# Patient Record
Sex: Male | Born: 1970 | Race: White | Hispanic: No | Marital: Married | Smoking: Former smoker
Health system: Southern US, Community
[De-identification: ages and names within clinical notes are randomized; demographics above are authoritative.]

## PROBLEM LIST (undated history)

## (undated) DIAGNOSIS — S8990XA Unspecified injury of unspecified lower leg, initial encounter: Secondary | ICD-10-CM

## (undated) DIAGNOSIS — S3992XA Unspecified injury of lower back, initial encounter: Secondary | ICD-10-CM

## (undated) DIAGNOSIS — F329 Major depressive disorder, single episode, unspecified: Secondary | ICD-10-CM

## (undated) DIAGNOSIS — F32A Depression, unspecified: Secondary | ICD-10-CM

## (undated) DIAGNOSIS — K219 Gastro-esophageal reflux disease without esophagitis: Secondary | ICD-10-CM

## (undated) HISTORY — DX: Depression, unspecified: F32.A

## (undated) HISTORY — PX: KNEE SURGERY: SHX244

## (undated) HISTORY — DX: Unspecified injury of unspecified lower leg, initial encounter: S89.90XA

## (undated) HISTORY — PX: OTHER SURGICAL HISTORY: SHX169

## (undated) HISTORY — PX: TYMPANOSTOMY TUBE PLACEMENT: SHX32

## (undated) HISTORY — DX: Major depressive disorder, single episode, unspecified: F32.9

## (undated) HISTORY — DX: Unspecified injury of lower back, initial encounter: S39.92XA

## (undated) HISTORY — DX: Gastro-esophageal reflux disease without esophagitis: K21.9

---

## 2000-09-05 ENCOUNTER — Emergency Department (HOSPITAL_COMMUNITY): Admission: EM | Admit: 2000-09-05 | Discharge: 2000-09-06 | Payer: Self-pay

## 2001-09-28 ENCOUNTER — Encounter: Payer: Self-pay | Admitting: Neurosurgery

## 2001-09-28 ENCOUNTER — Encounter: Admission: RE | Admit: 2001-09-28 | Discharge: 2001-09-28 | Payer: Self-pay | Admitting: Neurosurgery

## 2002-10-03 ENCOUNTER — Emergency Department (HOSPITAL_COMMUNITY): Admission: EM | Admit: 2002-10-03 | Discharge: 2002-10-03 | Payer: Self-pay | Admitting: Emergency Medicine

## 2002-10-03 ENCOUNTER — Encounter: Payer: Self-pay | Admitting: Emergency Medicine

## 2002-11-03 ENCOUNTER — Emergency Department (HOSPITAL_COMMUNITY): Admission: EM | Admit: 2002-11-03 | Discharge: 2002-11-03 | Payer: Self-pay | Admitting: Emergency Medicine

## 2003-05-25 ENCOUNTER — Emergency Department (HOSPITAL_COMMUNITY): Admission: EM | Admit: 2003-05-25 | Discharge: 2003-05-25 | Payer: Self-pay | Admitting: Emergency Medicine

## 2005-05-07 ENCOUNTER — Emergency Department (HOSPITAL_COMMUNITY): Admission: EM | Admit: 2005-05-07 | Discharge: 2005-05-07 | Payer: Self-pay | Admitting: Family Medicine

## 2005-08-03 ENCOUNTER — Emergency Department (HOSPITAL_COMMUNITY): Admission: EM | Admit: 2005-08-03 | Discharge: 2005-08-03 | Payer: Self-pay | Admitting: Family Medicine

## 2005-10-16 ENCOUNTER — Emergency Department (HOSPITAL_COMMUNITY): Admission: EM | Admit: 2005-10-16 | Discharge: 2005-10-16 | Payer: Self-pay | Admitting: Emergency Medicine

## 2006-04-22 ENCOUNTER — Ambulatory Visit: Payer: Self-pay | Admitting: Family Medicine

## 2006-04-22 DIAGNOSIS — F17200 Nicotine dependence, unspecified, uncomplicated: Secondary | ICD-10-CM | POA: Insufficient documentation

## 2006-04-22 DIAGNOSIS — F341 Dysthymic disorder: Secondary | ICD-10-CM

## 2006-04-28 ENCOUNTER — Encounter (INDEPENDENT_AMBULATORY_CARE_PROVIDER_SITE_OTHER): Payer: Self-pay | Admitting: Family Medicine

## 2006-05-06 ENCOUNTER — Ambulatory Visit: Payer: Self-pay | Admitting: Family Medicine

## 2006-05-07 LAB — CONVERTED CEMR LAB
Basophils Absolute: 0 10*3/uL (ref 0.0–0.1)
Basophils Relative: 0 % (ref 0–1)
Eosinophils Absolute: 0.2 10*3/uL (ref 0.0–0.7)
Eosinophils Relative: 4 % (ref 0–5)
HCT: 44.9 % (ref 39.0–52.0)
Hemoglobin: 15 g/dL (ref 13.0–17.0)
Lymphocytes Relative: 27 % (ref 12–46)
Lymphs Abs: 1.5 10*3/uL (ref 0.7–3.3)
MCHC: 33.4 g/dL (ref 30.0–36.0)
MCV: 92.8 fL (ref 78.0–100.0)
Monocytes Absolute: 0.8 10*3/uL — ABNORMAL HIGH (ref 0.2–0.7)
Monocytes Relative: 14 % — ABNORMAL HIGH (ref 3–11)
Neutro Abs: 3.1 10*3/uL (ref 1.7–7.7)
Neutrophils Relative %: 54 % (ref 43–77)
Platelets: 238 10*3/uL (ref 150–400)
RBC: 4.84 M/uL (ref 4.22–5.81)
RDW: 12.9 % (ref 11.5–14.0)
TSH: 1.243 microintl units/mL (ref 0.350–5.50)
WBC: 5.7 10*3/uL (ref 4.0–10.5)

## 2006-08-05 ENCOUNTER — Ambulatory Visit: Payer: Self-pay | Admitting: Family Medicine

## 2006-11-04 ENCOUNTER — Ambulatory Visit: Payer: Self-pay | Admitting: Family Medicine

## 2006-11-04 DIAGNOSIS — M25529 Pain in unspecified elbow: Secondary | ICD-10-CM

## 2007-02-03 ENCOUNTER — Ambulatory Visit: Payer: Self-pay | Admitting: Family Medicine

## 2007-02-03 ENCOUNTER — Telehealth (INDEPENDENT_AMBULATORY_CARE_PROVIDER_SITE_OTHER): Payer: Self-pay | Admitting: *Deleted

## 2007-02-03 DIAGNOSIS — J342 Deviated nasal septum: Secondary | ICD-10-CM | POA: Insufficient documentation

## 2007-06-17 ENCOUNTER — Telehealth (INDEPENDENT_AMBULATORY_CARE_PROVIDER_SITE_OTHER): Payer: Self-pay | Admitting: *Deleted

## 2007-06-17 ENCOUNTER — Ambulatory Visit: Payer: Self-pay | Admitting: Family Medicine

## 2007-07-15 ENCOUNTER — Ambulatory Visit: Payer: Self-pay | Admitting: Internal Medicine

## 2007-07-15 DIAGNOSIS — J029 Acute pharyngitis, unspecified: Secondary | ICD-10-CM

## 2007-07-15 DIAGNOSIS — H6690 Otitis media, unspecified, unspecified ear: Secondary | ICD-10-CM | POA: Insufficient documentation

## 2007-07-17 ENCOUNTER — Ambulatory Visit (HOSPITAL_COMMUNITY): Payer: Self-pay | Admitting: Psychology

## 2007-11-25 ENCOUNTER — Ambulatory Visit: Payer: Self-pay | Admitting: Family Medicine

## 2007-11-25 DIAGNOSIS — R109 Unspecified abdominal pain: Secondary | ICD-10-CM

## 2010-03-20 NOTE — Assessment & Plan Note (Signed)
Summary: AOM/sore throat   Vital Signs:  Patient Profile:   40 Years Old Male Height:     72 inches O2 Sat:      97 % O2 treatment:    Room Air Temp:     98.9 degrees F tympanic Pulse rate:   81 / minute Resp:     8 per minute BP sitting:   112 / 76  (right arm)  Vitals Entered By: Lutricia Horsfall (Jul 15, 2007 1:31 PM)                 PCP:  Franchot Heidelberg  Chief Complaint:  sore throat and right ear pain.  History of Present Illness: 1 week ago complains of a "sinus infection" with nasal drainage, no fevers.  He says when he has a sinus infection it progresses into his ears due to a deviated septum.  He has been having pain in his right ear and throat for 2 days that has been getting worse.  He says he gets ear infections about every 3-4 months with the change of season.  He says he was scheduled last week to see ENT for the deviated septum but was unable to make the appointment.  He is working on trying to get it rescheduled with his work schedule.    Current Allergies (reviewed today): No known allergies   Past Medical History:    Reviewed history from 04/22/2006 and no changes required:       Depression      Physical Exam  General:     alert, well-developed, and well-nourished.   Ears:     TM clear on left, dull and red streaked on right.   Mouth:     Mild erythema, no exudate.   Neck:     no cervical lymphadenopathy.      Impression & Recommendations:  Problem # 1:  OTITIS MEDIA, ACUTE (ICD-382.9) Z-pack.  Sudafed and tylenol recommended for symptomatic relief.    His updated medication list for this problem includes:    Zithromax Z-pak 250 Mg Tabs (Azithromycin) ..... Use as directed   Problem # 2:  SORE THROAT (ICD-462) Suspect some eustacian tube dysfunction.   His updated medication list for this problem includes:    Zithromax Z-pak 250 Mg Tabs (Azithromycin) ..... Use as directed  Orders: Rapid Strep ([address])   Complete  Medication List: 1)  Zithromax Z-pak 250 Mg Tabs (Azithromycin) .... Use as directed   Patient Instructions: 1)  Please schedule a follow-up appointment as needed.   Prescriptions: ZITHROMAX Z-PAK 250 MG  TABS (AZITHROMYCIN) use as directed  #1 pack x 0   Entered and Authorized by:   Erle Crocker MD   Signed by:   Erle Crocker MD on 07/15/2007   Method used:   Print then Give to Patient   RxID:   [id number]  ] Laboratory Results  Date/Time Received: Jul 15, 2007 1:31 PM  Comments: Lot # [id number] exp 06/10/08

## 2011-02-05 ENCOUNTER — Other Ambulatory Visit: Payer: Self-pay | Admitting: Occupational Medicine

## 2011-02-05 ENCOUNTER — Ambulatory Visit: Payer: Self-pay

## 2011-02-05 DIAGNOSIS — R52 Pain, unspecified: Secondary | ICD-10-CM

## 2011-10-24 ENCOUNTER — Encounter: Payer: Self-pay | Admitting: Gastroenterology

## 2011-11-20 ENCOUNTER — Encounter: Payer: Self-pay | Admitting: Gastroenterology

## 2011-11-20 ENCOUNTER — Other Ambulatory Visit: Payer: 59

## 2011-11-20 ENCOUNTER — Ambulatory Visit (INDEPENDENT_AMBULATORY_CARE_PROVIDER_SITE_OTHER): Payer: 59 | Admitting: Gastroenterology

## 2011-11-20 VITALS — BP 102/70 | HR 80 | Ht 72.0 in | Wt 213.6 lb

## 2011-11-20 DIAGNOSIS — R197 Diarrhea, unspecified: Secondary | ICD-10-CM

## 2011-11-20 MED ORDER — PEG-KCL-NACL-NASULF-NA ASC-C 100 G PO SOLR: 1.0000 | Freq: Once | ORAL | Status: DC

## 2011-11-20 NOTE — Patient Instructions (Addendum)
You will have labs checked today in the basement lab.  Please head down after you check out with the front desk  (C. Difficile by PCR, stool for ova/parasites, routine culture, celiac sprue panel). You will be set up for a colonoscopy in 1-2 weeks for diarrhea.

## 2011-11-20 NOTE — Progress Notes (Signed)
HPI: This is a  very pleasant 41 year old man whom I am meeting for the first time today.    Will need to have BM frequently, usually goes 20 times a day.  Usually toothpaste consistency. Started shortly after starting prilosec.  Was on a lot of NSAIDs for knee pain, meloxicam.  Previouly on 1.6-2gram advil a day.  At age 71 was on a lot of NSAIDs.  No antibioitics prior to the diarrhea.  This loose stools started this past June.  Never bloody.  Can have mucousy discharge.  Stopped prilosec about a month after taking it, blaming the diarrhea on the med.  A year ago, one BM a day, maybe two.  Drinks 4-5 caffinated bevs a day.  Non etoh drinker.  No abd pains.  Overall stable weight.  Saw Dr. Elnoria Howard, wasn't happy with that interaction.  He has tried avoiding milk, caffeine and neither of these helped.    Review of systems: Pertinent positive and negative review of systems were noted in the above HPI section. Complete review of systems was performed and was otherwise normal.    Past Medical History  Diagnosis Date  . Back injury   . Knee injury     Past Surgical History  Procedure Date  . Tympanostomy tube placement   . Knee surgery     right    No current outpatient prescriptions on file.    Allergies as of 11/20/2011  . (No Known Allergies)    Family History  Problem Relation Age of Onset  . Colon cancer Maternal Grandmother 95    History   Social History  . Marital Status: Married    Spouse Name: N/A    Number of Children: 4  . Years of Education: N/A   Occupational History  .     Social History Main Topics  . Smoking status: Former Smoker    Quit date: 11/19/2009  . Smokeless tobacco: Never Used  . Alcohol Use: No  . Drug Use: No  . Sexually Active: Not on file   Other Topics Concern  . Not on file   Social History Narrative  . No narrative on file       Physical Exam: BP 102/70  Pulse 80  Ht 6' (1.829 m)  Wt 213 lb 9.6 oz (96.888  kg)  BMI 28.97 kg/m2 Constitutional: generally well-appearing Psychiatric: alert and oriented x3 Eyes: extraocular movements intact Mouth: oral pharynx moist, no lesions Neck: supple no lymphadenopathy Cardiovascular: heart regular rate and rhythm Lungs: clear to auscultation bilaterally Abdomen: soft, nontender, nondistended, no obvious ascites, no peritoneal signs, normal bowel sounds Extremities: no lower extremity edema bilaterally Skin: no lesions on visible extremities    Assessment and plan: 41 y.o. male with  chronic diarrhea for the past 3-4 months.  Chronic infection seems unlikely however I will testing for Clostridium difficile, although parasites, routine culture. Perhaps microscopic colitis caused by proton pump inhibitor, perhaps he has inflammatory bowel disease. He will likely colonoscopy which we'll schedule for 2-3 weeks for now and if we have an answer by other testing, see below, we can cancel that procedure. I am also testing for celiac sprue.

## 2011-11-21 LAB — CELIAC PANEL 10
Gliadin IgA: 4.2 U/mL (ref <20)
Gliadin IgG: 5.4 U/mL (ref <20)

## 2011-11-26 ENCOUNTER — Other Ambulatory Visit: Payer: 59

## 2011-11-26 DIAGNOSIS — R197 Diarrhea, unspecified: Secondary | ICD-10-CM

## 2011-11-28 LAB — OVA AND PARASITE SCREEN: OP: NONE SEEN

## 2011-11-29 LAB — CLOSTRIDIUM DIFFICILE BY PCR: Toxigenic C. Difficile by PCR: NOT DETECTED

## 2011-11-30 LAB — STOOL CULTURE

## 2011-12-02 NOTE — Progress Notes (Signed)
We spoke on the phone and I answered all his questions

## 2011-12-11 ENCOUNTER — Telehealth: Payer: Self-pay | Admitting: Gastroenterology

## 2011-12-11 MED ORDER — PEG-KCL-NACL-NASULF-NA ASC-C 100 G PO SOLR: 1.0000 | Freq: Once | ORAL | Status: DC

## 2011-12-11 NOTE — Telephone Encounter (Signed)
rx has been resent 

## 2011-12-16 ENCOUNTER — Ambulatory Visit (AMBULATORY_SURGERY_CENTER): Payer: 59 | Admitting: Gastroenterology

## 2011-12-16 ENCOUNTER — Encounter: Payer: Self-pay | Admitting: Gastroenterology

## 2011-12-16 VITALS — BP 100/66 | HR 76 | Temp 98.9°F | Resp 16 | Ht 72.0 in | Wt 213.0 lb

## 2011-12-16 DIAGNOSIS — D126 Benign neoplasm of colon, unspecified: Secondary | ICD-10-CM

## 2011-12-16 DIAGNOSIS — R197 Diarrhea, unspecified: Secondary | ICD-10-CM

## 2011-12-16 MED ORDER — SODIUM CHLORIDE 0.9 % IV SOLN: 500.0000 mL | INTRAVENOUS | Status: DC

## 2011-12-16 NOTE — Progress Notes (Signed)
Patient did not experience any of the following events: a burn prior to discharge; a fall within the facility; wrong site/side/patient/procedure/implant event; or a hospital transfer or hospital admission upon discharge from the facility. (G8907) Patient did not have preoperative order for IV antibiotic SSI prophylaxis. (G8918)  

## 2011-12-16 NOTE — Op Note (Signed)
[facility] [address] [place], [address]   COLONOSCOPY PROCEDURE REPORT PATIENT: Timothy Ortiz, Timothy Ortiz  MR#: [id number] BIRTHDATE: 1970/11/19 , 41  yrs. old GENDER: Male ENDOSCOPIST: Rachael Fee, MD REFERRED BY: PROCEDURE DATE:  12/16/2011 PROCEDURE:   Colonoscopy with biopsy and Colonoscopy with snare polypectomy ASA CLASS:   Class II INDICATIONS:several months of diarrhea. MEDICATIONS: Fentanyl 50 mcg IV, Versed 8 mg IV, and These medications were titrated to patient response per physician's verbal order DESCRIPTION OF PROCEDURE:   After the risks benefits and alternatives of the procedure were thoroughly explained, informed consent was obtained.  A digital rectal exam revealed no abnormalities of the rectum.   The LB [id number]  endoscope was introduced through the anus and advanced to the terminal ileum which was intubated for a short distance. No adverse events experienced.   The quality of the prep was good, using MoviPrep The instrument was then slowly withdrawn as the colon was fully examined.  COLON FINDINGS: The mucosa appeared normal in the terminal ileum. Four sessile polyps were found in colon.  These were all removed and all sent to pathology.  The largest was in descending, 1.0cm across, removed with snare/cautery.  The rest were 4-34mm aross, located in ascending, transverse, sigmoid segments, all removed with cold snare.   The colon mucosa was otherwise normal. The colon mucosa was randomly biopsied and sent to pathology.  Retroflexed views revealed no abnormalities. The time to cecum=2 minutes 07 seconds.  Withdrawal time=11 minutes 50 seconds.  The scope was withdrawn and the procedure completed. COMPLICATIONS: There were no complications.  ENDOSCOPIC IMPRESSION: 1.   Normal mucosa in the terminal ileum 2.   Four sessile polyps were found in colon.  These were all removed and all sent to pathology. 3.   The colon mucosa was  otherwise normal, randomly biopsied to check for Microscopic Colitis.  RECOMMENDATIONS: 1. If the polyp(s) removed today are proven to be adenomatous (pre-cancerous) polyps, you will need a colonoscopy in 3-5 years. Otherwise you should continue to follow colorectal cancer screening guidelines for "routine risk" patients with a colonoscopy in 10 years.  You will receive a letter within 1-2 weeks with the results of your biopsy as well as final recommendations.  Please call my office if you have not received a letter after 3 weeks. 2. Await final pathology from random colon biopsies.   eSigned:  Rachael Fee, MD 12/16/2011 1:51 PM

## 2011-12-16 NOTE — Patient Instructions (Addendum)
Discharge instructions given with verbal understanding. Handout on polyps given. Resume previous medications. YOU HAD AN ENDOSCOPIC PROCEDURE TODAY AT THE [facility]: Refer to the procedure report that was given to you for any specific questions about what was found during the examination.  If the procedure report does not answer your questions, please call your gastroenterologist to clarify.  If you requested that your care partner not be given the details of your procedure findings, then the procedure report has been included in a sealed envelope for you to review at your convenience later.  YOU SHOULD EXPECT: Some feelings of bloating in the abdomen. Passage of more gas than usual.  Walking can help get rid of the air that was put into your GI tract during the procedure and reduce the bloating. If you had a lower endoscopy (such as a colonoscopy or flexible sigmoidoscopy) you may notice spotting of blood in your stool or on the toilet paper. If you underwent a bowel prep for your procedure, then you may not have a normal bowel movement for a few days.  DIET: Your first meal following the procedure should be a light meal and then it is ok to progress to your normal diet.  A half-sandwich or bowl of soup is an example of a good first meal.  Heavy or fried foods are harder to digest and may make you feel nauseous or bloated.  Likewise meals heavy in dairy and vegetables can cause extra gas to form and this can also increase the bloating.  Drink plenty of fluids but you should avoid alcoholic beverages for 24 hours.  ACTIVITY: Your care partner should take you home directly after the procedure.  You should plan to take it easy, moving slowly for the rest of the day.  You can resume normal activity the day after the procedure however you should NOT DRIVE or use heavy machinery for 24 hours (because of the sedation medicines used during the test).    SYMPTOMS TO REPORT IMMEDIATELY: A  gastroenterologist can be reached at any hour.  During normal business hours, 8:30 AM to 5:00 PM Monday through Friday, call [phone].  After hours and on weekends, please call the GI answering service at [phone] who will take a message and have the physician on call contact you.   Following lower endoscopy (colonoscopy or flexible sigmoidoscopy):  Excessive amounts of blood in the stool  Significant tenderness or worsening of abdominal pains  Swelling of the abdomen that is new, acute  Fever of 100F or higher  FOLLOW UP: If any biopsies were taken you will be contacted by phone or by letter within the next 1-3 weeks.  Call your gastroenterologist if you have not heard about the biopsies in 3 weeks.  Our staff will call the home number listed on your records the next business day following your procedure to check on you and address any questions or concerns that you may have at that time regarding the information given to you following your procedure. This is a courtesy call and so if there is no answer at the home number and we have not heard from you through the emergency physician on call, we will assume that you have returned to your regular daily activities without incident.  SIGNATURES/CONFIDENTIALITY: You and/or your care partner have signed paperwork which will be entered into your electronic medical record.  These signatures attest to the fact that that the information above on your After Visit Summary has   been reviewed and is understood.  Full responsibility of the confidentiality of this discharge information lies with you and/or your care-partner. 

## 2011-12-17 ENCOUNTER — Telehealth: Payer: Self-pay | Admitting: *Deleted

## 2011-12-17 NOTE — Telephone Encounter (Signed)
No answer, message left for the patient. 

## 2011-12-22 ENCOUNTER — Encounter: Payer: Self-pay | Admitting: Gastroenterology

## 2011-12-25 ENCOUNTER — Telehealth: Payer: Self-pay | Admitting: Gastroenterology

## 2011-12-25 NOTE — Telephone Encounter (Signed)
Pt has tried imodium 2 pills 4 times a day which did not stop the diarrhea.  Pt had colon on 12/16/11.  Normal stool cultures.  Pt has also tried altering his diet which has not helped.  He was told he was given Cipro and Parigoric in August when seen in Urgent care and it helped.  Please advise.

## 2011-12-26 MED ORDER — CHOLESTYRAMINE 4 G PO PACK: 1.0000 | PACK | Freq: Two times a day (BID) | ORAL | Status: DC

## 2011-12-26 NOTE — Telephone Encounter (Signed)
Trial of cholestyramine 4gram packet, take one packet twice daily.  Disp 60 with 3 refills.  rov with me in 4 weeks, sooner if needed. Needs to signficantly cut back his caffeine intake as well

## 2011-12-26 NOTE — Telephone Encounter (Signed)
Appt made and med sent to the pharmacy

## 2012-01-09 ENCOUNTER — Telehealth: Payer: Self-pay | Admitting: Gastroenterology

## 2012-01-10 NOTE — Telephone Encounter (Signed)
Pt has tried cholestyramine and Imodium for diarrhea he says both have made the diarrhea worse.  He wants something else called in.  He has an appt for 01/27/12 with Dr Christella Hartigan to discuss.  Pt had colon 12/16/11  ENDOSCOPIC IMPRESSION:  1. Normal mucosa in the terminal ileum  2. Four sessile polyps were found in colon. These were all  removed and all sent to pathology.  3. The colon mucosa was otherwise normal, randomly biopsied to  check for Microscopic Colitis.  Please advise

## 2012-01-10 NOTE — Telephone Encounter (Signed)
Left message on machine to call back  

## 2012-01-14 NOTE — Telephone Encounter (Signed)
Ok, lets try lomotil, 2 pills every morning after waking up and two pills later  In the day.  Disp 120, refills 3.  Will discuss further at next rov.

## 2012-01-14 NOTE — Telephone Encounter (Signed)
Left message on machine to call back  

## 2012-01-15 NOTE — Telephone Encounter (Signed)
Pt declines to try the lomotil he says that he doesn't want to take any more medicine because " it all makes it worse and doesn't fix anything"  He will keep the upcoming appt and discuss with Dr Christella Hartigan

## 2012-01-15 NOTE — Telephone Encounter (Signed)
ok 

## 2012-01-27 ENCOUNTER — Encounter: Payer: Self-pay | Admitting: Gastroenterology

## 2012-01-27 ENCOUNTER — Other Ambulatory Visit (INDEPENDENT_AMBULATORY_CARE_PROVIDER_SITE_OTHER): Payer: 59

## 2012-01-27 ENCOUNTER — Ambulatory Visit (INDEPENDENT_AMBULATORY_CARE_PROVIDER_SITE_OTHER): Payer: 59 | Admitting: Gastroenterology

## 2012-01-27 VITALS — BP 104/60 | HR 84 | Ht 71.5 in | Wt 219.1 lb

## 2012-01-27 DIAGNOSIS — R197 Diarrhea, unspecified: Secondary | ICD-10-CM

## 2012-01-27 LAB — COMPREHENSIVE METABOLIC PANEL
AST: 29 U/L (ref 0–37)
Alkaline Phosphatase: 69 U/L (ref 39–117)
BUN: 8 mg/dL (ref 6–23)
Creatinine, Ser: 0.9 mg/dL (ref 0.4–1.5)

## 2012-01-27 MED ORDER — METRONIDAZOLE 250 MG PO TABS: 500.0000 mg | ORAL_TABLET | Freq: Three times a day (TID) | ORAL | Status: AC

## 2012-01-27 NOTE — Progress Notes (Signed)
Review of pertinent gastrointestinal problems: 1. Chronic loose stools: 11/2011 colonoscopy normal(exept small TAs) to TI, random biopsies normal. Labs 10/13 celiac panel normal, stool testing all normal. 2. Adenomatous polyps:  small TAs removed 11/2011, recommended repeat colonoscopy at 3 year interval.  HPI: This is a   very pleasant 41 year old man who is here with his wife today. I last saw him, colonoscopy 6 weeks ago.  Since colonoscopy 11/2011.  Diarrhea, gas, mucous started relatively overnight.  Usually a lot of mucous production.  Nocturnal symptoms as well.  Was put on cipro for 5 days and thing clearly improved for a brief time.  No sick contacts.   Moved recently but symptoms started before the move. This all started acutely this past spring.  He has tried multiple doses of Imodium, he has tried cholestyramine. None of this seems to help.  Having joint problems. Drinking makes it worse.    Past Medical History  Diagnosis Date  . Back injury   . Knee injury   . Depression   . GERD (gastroesophageal reflux disease)     Past Surgical History  Procedure Date  . Tympanostomy tube placement   . Knee surgery     right    No current outpatient prescriptions on file.    Allergies as of 01/27/2012  . (No Known Allergies)    Family History  Problem Relation Age of Onset  . Colon cancer Maternal Grandmother 95    History   Social History  . Marital Status: Married    Spouse Name: N/A    Number of Children: 4  . Years of Education: N/A   Occupational History  .     Social History Main Topics  . Smoking status: Former Smoker    Quit date: 11/19/2009  . Smokeless tobacco: Never Used  . Alcohol Use: No  . Drug Use: No  . Sexually Active: Not on file   Other Topics Concern  . Not on file   Social History Narrative  . No narrative on file      Physical Exam: Ht 5' 11.5" (1.816 m)  Wt 219 lb 2 oz (99.394 kg)  BMI 30.14 kg/m2 Constitutional:  generally well-appearing Psychiatric: alert and oriented x3 Abdomen: soft, nontender, nondistended, no obvious ascites, no peritoneal signs, normal bowel sounds     Assessment and plan: 41 y.o. male with chronic loose stools  Perhaps he has paracytic infection such as Giardia. Previous stool testing was all negative however given the chronic nature now going to start him on metronidazole 500 mg 3 times daily for 10 days. He is also get a repeat set of stool cultures today. Seems unlikely he has new onset Crohn's disease with a normal terminal ileum. To be more  certain however I'm going to have [facility] testing performed. He will call to report on his symptoms in 7 days. He was a bit frustrated how long it took him to get back in to see me and we will fit him in more urgently whenever needed.

## 2012-01-27 NOTE — Patient Instructions (Addendum)
Trial of flagyl. One pill three times a day for 10 days. You will have labs checked today in the basement lab.  Please head down after you check out with the front desk  (cmet, prometheus IBD testing, stool testing for giardia, ova and parasites). Call in 7 days to report on your symptoms. Will fit you in urgently if needed before then.

## 2012-01-28 ENCOUNTER — Telehealth: Payer: Self-pay | Admitting: Gastroenterology

## 2012-01-28 ENCOUNTER — Other Ambulatory Visit: Payer: 59

## 2012-01-28 DIAGNOSIS — R197 Diarrhea, unspecified: Secondary | ICD-10-CM

## 2012-01-28 NOTE — Telephone Encounter (Signed)
See result note.  

## 2012-01-30 LAB — OVA AND PARASITE SCREEN: OP: NONE SEEN

## 2012-02-04 ENCOUNTER — Telehealth: Payer: Self-pay | Admitting: Gastroenterology

## 2012-02-04 NOTE — Telephone Encounter (Signed)
Pt seen on 01/27/12 for problems that started post COLON; gas, diarrhea- mucus stools, urgency. He was started on Metronidazole 500 mg tid x 10 days. Pt called to give an update on his condition. He states he is at least 50% better. He no longer has mucus in his tolls and the number of stools have decreased to 1/3 of the number previously. He has no urgency or cramping. Pt has a day or so of metronidazole left and I told him, it may be that he is expecting too much too soon. Pt states he's so much better and doesn't want to complain; he's just wondering what to expect. Please advise. Thanks.

## 2012-02-06 NOTE — Telephone Encounter (Signed)
Informed pt of Dr Christella Hartigan' note. Pt stated understanding and will call if he feels he needs to be seen.

## 2012-02-06 NOTE — Telephone Encounter (Signed)
It could take days to weeks for bowels to completely return to normal.  I am encouraged since he has already noticed clear improvement.

## 2012-04-28 ENCOUNTER — Ambulatory Visit (INDEPENDENT_AMBULATORY_CARE_PROVIDER_SITE_OTHER): Payer: 59 | Admitting: Gastroenterology

## 2012-04-28 ENCOUNTER — Encounter: Payer: Self-pay | Admitting: Gastroenterology

## 2012-04-28 ENCOUNTER — Other Ambulatory Visit: Payer: 59

## 2012-04-28 VITALS — BP 122/78 | HR 68 | Ht 71.5 in | Wt 222.4 lb

## 2012-04-28 DIAGNOSIS — R197 Diarrhea, unspecified: Secondary | ICD-10-CM

## 2012-04-28 MED ORDER — PROBIOTIC DAILY PO CAPS: 1.0000 | ORAL_CAPSULE | Freq: Every day | ORAL | Status: DC

## 2012-04-28 NOTE — Patient Instructions (Addendum)
Cut back on caffeine, can contribute to diarrhea. Stool pathogen panel. Stool for fecal fat (quantitative test). Please restart imodium 1 pill twice daily. Stool for magnesium, phenopthalene (ordered already).                                                We are excited to introduce MyChart, a new best-in-class service that provides you online access to important information in your electronic medical record. We want to make it easier for you to view your health information - all in one secure location - when and where you need it. We expect MyChart will enhance the quality of care and service we provide.  When you register for MyChart, you can:    View your test results.    Request appointments and receive appointment reminders via email.    Request medication renewals.    View your medical history, allergies, medications and immunizations.    Communicate with your physician's office through a password-protected site.    Conveniently print information such as your medication lists.  To find out if MyChart is right for you, please talk to a member of our clinical staff today. We will gladly answer your questions about this free health and wellness tool.  If you are age 41 or older and want a member of your family to have access to your record, you must provide written consent by completing a proxy form available at our office. Please speak to our clinical staff about guidelines regarding accounts for patients younger than age 65.  As you activate your MyChart account and need any technical assistance, please call the MyChart technical support line at [phone]) or email your question to [email or web address]. If you email your question(s), please include your name, a return phone number and the best time to reach you.  If you have non-urgent health-related questions, you can send a message to our office through MyChart at [email or web address]. If you have a  medical emergency, call 911.  Thank you for using MyChart as your new health and wellness resource!   MyChart licensed from [employer],  [id number]. Patents Pending.

## 2012-04-28 NOTE — Progress Notes (Signed)
Review of pertinent gastrointestinal problems:  1. Chronic loose stools: 11/2011 colonoscopy normal(exept small TAs) to TI, random biopsies normal. Labs 10/13 celiac panel normal, stool testing all normal. 01/2012 started on flagyl empirically with intitally good improvement in symptoms. 2. Adenomatous polyps: small TAs removed 11/2011, recommended repeat colonoscopy at 3 year interval.   HPI: This is a    very pleasant 42 year old man whom I last saw 1 or 2 months ago.  He felt very well during and then after the flagyl for 3 weeks.  BMs down to 5-6 times per day.  Still usually loose.  + intermittent nocturnal symptoms. He has gone 12 times today, "mushy solid," mucous or pus like per patient.   He's drinking 2 lites of sun-drop per day.  Weight up 3 pounds since last visit.  He takes probiotic daily, when he misses he has worse days, more diarrhea.  Never bleeding.  Past Medical History  Diagnosis Date  . Back injury   . Knee injury   . Depression   . GERD (gastroesophageal reflux disease)     Past Surgical History  Procedure Laterality Date  . Tympanostomy tube placement    . Knee surgery      right    Current Outpatient Prescriptions  Medication Sig Dispense Refill  . Probiotic Product (PROBIOTIC DAILY) CAPS Take 1 capsule by mouth daily.       No current facility-administered medications for this visit.    Allergies as of 04/28/2012  . (No Known Allergies)    Family History  Problem Relation Age of Onset  . Colon cancer Maternal Grandmother 95    History   Social History  . Marital Status: Married    Spouse Name: N/A    Number of Children: 4  . Years of Education: N/A   Occupational History  .     Social History Main Topics  . Smoking status: Former Smoker    Quit date: 11/19/2009  . Smokeless tobacco: Never Used  . Alcohol Use: No  . Drug Use: No  . Sexually Active: Not on file   Other Topics Concern  . Not on file   Social History  Narrative  . No narrative on file      Physical Exam: BP 122/78  Pulse 68  Ht 5' 11.5" (1.816 m)  Wt 222 lb 6.4 oz (100.88 kg)  BMI 30.59 kg/m2 Constitutional: generally well-appearing Psychiatric: alert and oriented x3 Abdomen: soft, nontender, nondistended, no obvious ascites, no peritoneal signs, normal bowel sounds     Assessment and plan: 42 y.o. male with chronic diarrhea  Unclear etiology however he does seem to respond to antibiotics for a short time." He says his stool for much more complete stool pathogen panel, I'm also testing for magnesium,phenopthalene.  He is going to start twice daily Imodium for now.

## 2012-04-29 ENCOUNTER — Ambulatory Visit: Payer: 59

## 2012-04-29 ENCOUNTER — Other Ambulatory Visit: Payer: 59

## 2012-04-29 ENCOUNTER — Other Ambulatory Visit: Payer: Self-pay | Admitting: Gastroenterology

## 2012-04-29 DIAGNOSIS — R197 Diarrhea, unspecified: Secondary | ICD-10-CM

## 2012-05-05 LAB — PHENOLPHTHALEIN, STOOL: Phenolphthalein, Feces: NEGATIVE

## 2012-05-26 MED ORDER — PAREGORIC 2 MG/5ML PO TINC: 5.0000 mL | Freq: Four times a day (QID) | ORAL | Status: DC | PRN

## 2012-05-29 ENCOUNTER — Telehealth: Payer: Self-pay | Admitting: Gastroenterology

## 2012-05-29 NOTE — Telephone Encounter (Signed)
Pt has an order for stool studies to be done but he states he just have them done. Pt wants to know why he needs to repeat them. Let him know Alexia Freestone will call him on Monday.

## 2012-06-01 NOTE — Telephone Encounter (Signed)
Pt was notified that the test for stool that Dr Christella Hartigan is recommending is testing for different things from what he had before.  Pt will call with any further questions or concerns

## 2012-06-01 NOTE — Telephone Encounter (Signed)
Left message on machine to call back  

## 2012-06-05 ENCOUNTER — Ambulatory Visit: Payer: 59

## 2012-06-05 DIAGNOSIS — R197 Diarrhea, unspecified: Secondary | ICD-10-CM

## 2012-06-08 LAB — GASTROINTESTINAL PATHOGEN PANEL PCR
Campylobacter, PCR: NEGATIVE
Cryptosporidium, PCR: NEGATIVE
E coli (ETEC) LT/ST PCR: NEGATIVE
Giardia lamblia, PCR: NEGATIVE
Norovirus, PCR: NEGATIVE
Rotavirus A, PCR: NEGATIVE
Shigella, PCR: NEGATIVE

## 2012-06-08 LAB — FECAL FAT QUALITATIVE
Free Fatty Acids: NORMAL
NEUTRAL FAT: NORMAL

## 2012-06-19 ENCOUNTER — Telehealth: Payer: Self-pay | Admitting: Gastroenterology

## 2012-06-19 NOTE — Telephone Encounter (Signed)
Pt has been notified of the lab results, he also wants to call back to schedule follow up

## 2012-07-06 IMAGING — CR DG KNEE COMPLETE 4+V*R*
4 series · 4 of 4 positions shown · non-contrast
Comparison: None.

CLINICAL DATA: Medial right knee pain.

RIGHT KNEE - COMPLETE 4+ VIEW

[view not recorded (1 of 4)]
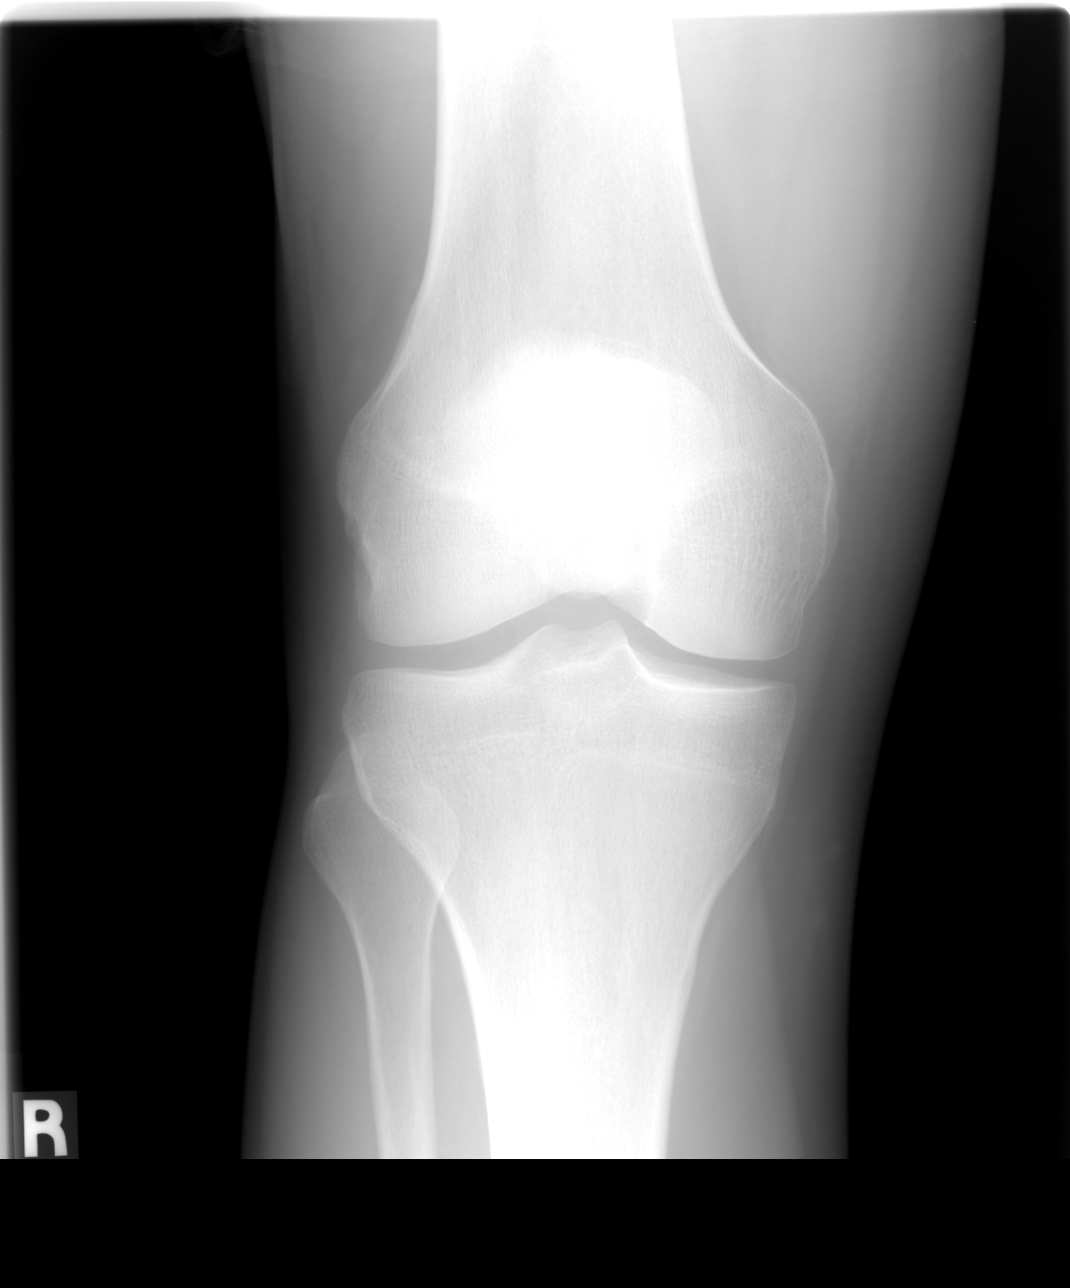

[view not recorded (2 of 4)]
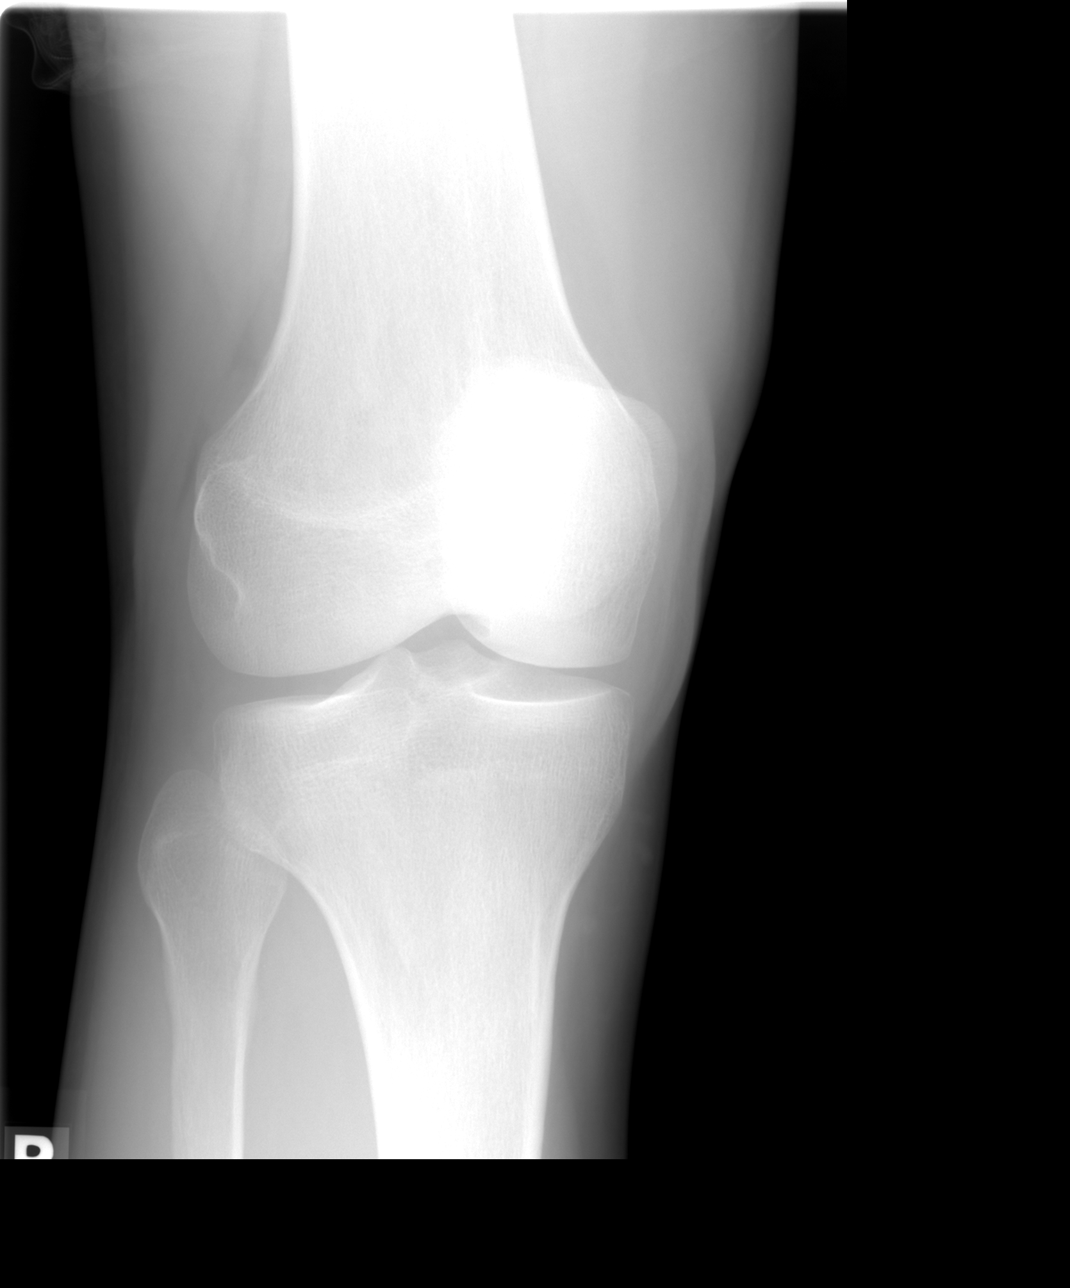

[view not recorded (3 of 4)]
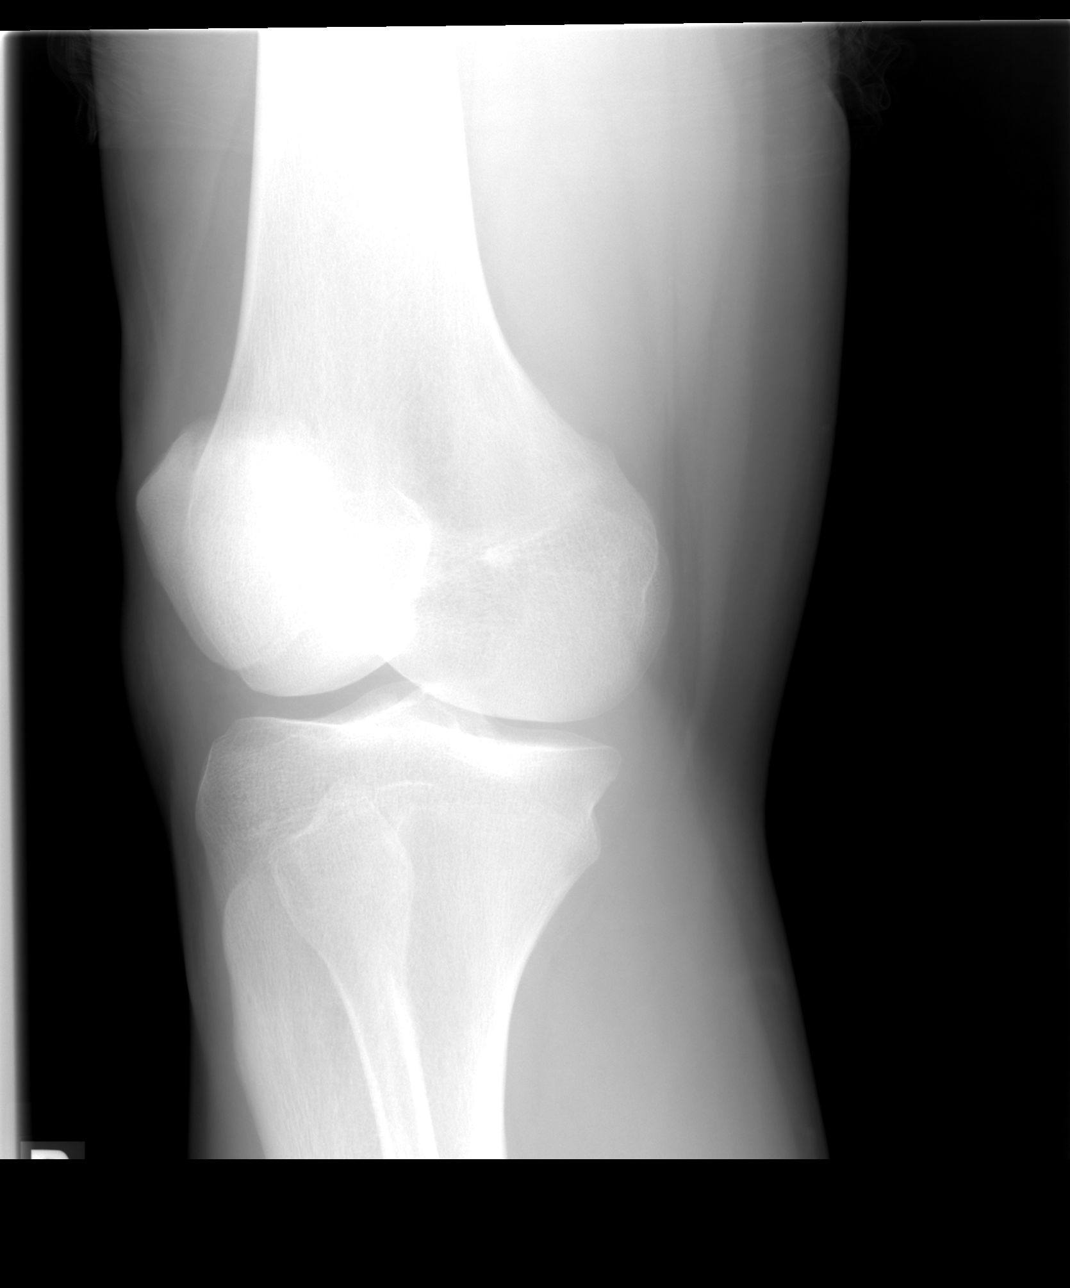

[view not recorded (4 of 4)]
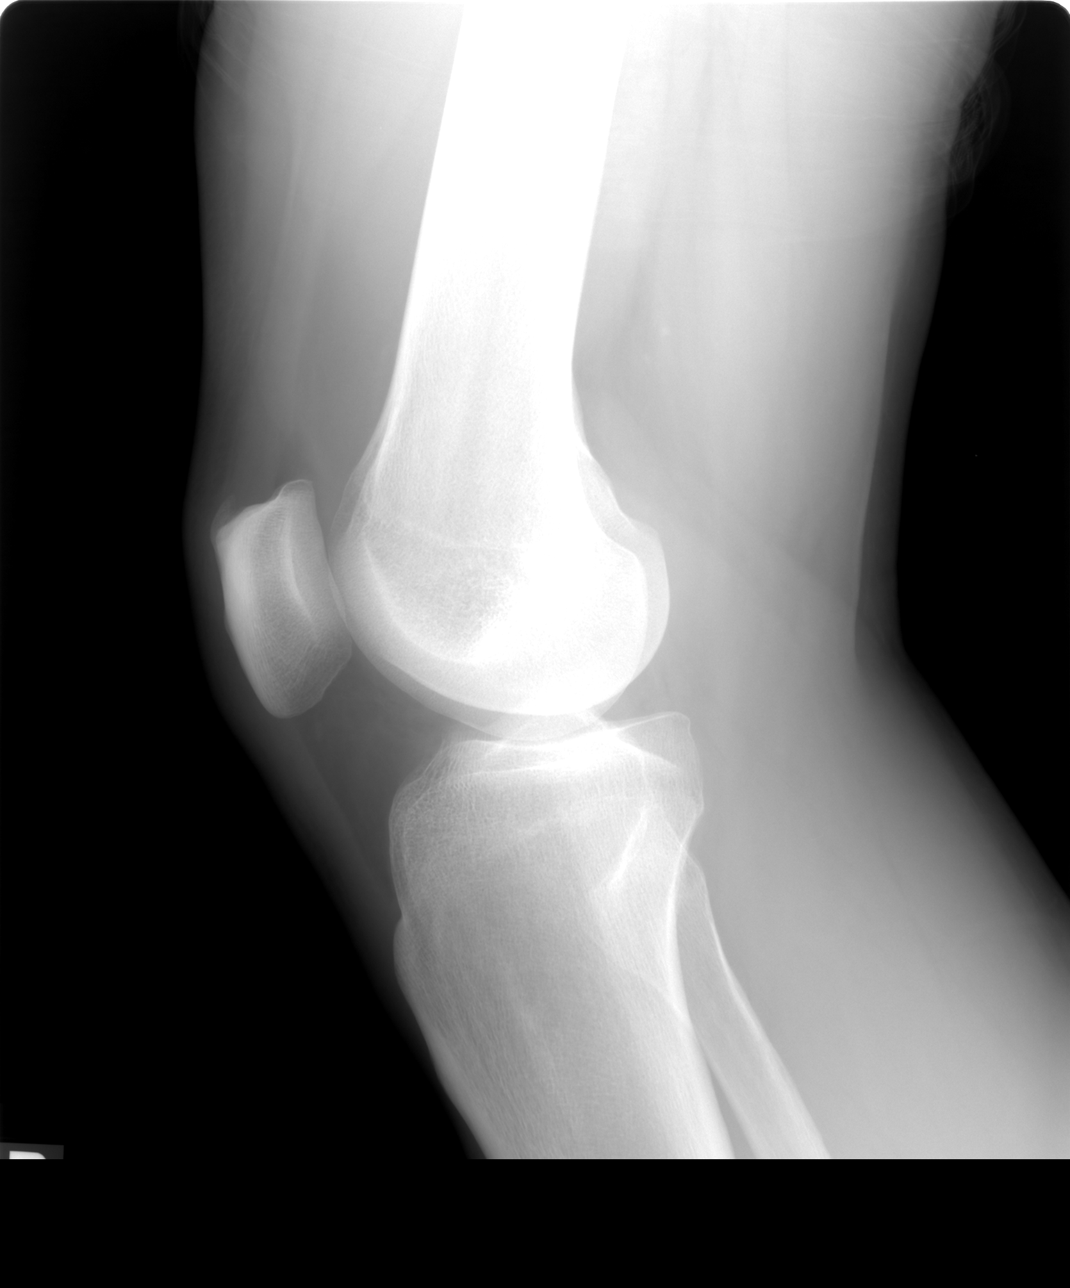

[4 of 4 positions shown; findings below may reference images not displayed]

FINDINGS: The osseous structures are essentially normal.  No
effusion or joint space narrowing or other abnormality.
IMPRESSION: No significant abnormality.

## 2012-08-25 ENCOUNTER — Telehealth: Payer: Self-pay | Admitting: Gastroenterology

## 2012-08-25 ENCOUNTER — Encounter: Payer: Self-pay | Admitting: *Deleted

## 2012-08-25 NOTE — Telephone Encounter (Signed)
Pt diarrhea has gotten worse, he was suppose to follow up with Dr Christella Hartigan but he had to wait on money to make appt he says he can not wait until 09/21/12 to see Dr Christella Hartigan wants to see extender.  Appt with [facility] 10/27/12 330 pm

## 2012-08-26 ENCOUNTER — Ambulatory Visit (INDEPENDENT_AMBULATORY_CARE_PROVIDER_SITE_OTHER): Payer: 59 | Admitting: Nurse Practitioner

## 2012-08-26 ENCOUNTER — Encounter: Payer: Self-pay | Admitting: Nurse Practitioner

## 2012-08-26 VITALS — BP 100/60 | HR 88 | Ht 72.0 in | Wt 223.0 lb

## 2012-08-26 DIAGNOSIS — R197 Diarrhea, unspecified: Secondary | ICD-10-CM

## 2012-08-26 MED ORDER — DIPHENOXYLATE-ATROPINE 2.5-0.025 MG PO TABS: ORAL_TABLET | ORAL | Status: DC

## 2012-08-26 NOTE — Patient Instructions (Addendum)
We have faxed a prescription for Lomotil to [facility], [place].  Take as needed for diarrhea. We will call you in a few days.

## 2012-08-26 NOTE — Progress Notes (Signed)
  History of Present Illness:  Patient is a 42 year old male followed by Dr. Christella Hartigan for chronic diarhea. He had normal BMs prior to June of last year. Extensive workup has been unrevealing. Normal colonoscopy (except for TAs) October 2013. Random colon biopsies negative. Celiac panel negative, stool pathogens negative. Stool magnesium normal. Stool phenolphtalein negative. Fecal fat normal. He responds to courses of flagyl but diarrhea recurs quickly after completion of antibiotics. Imodium BID hasn't really helped. Pepto doesn't help. Probiotics help with the gassiness but not diarrhea. Paragoric works but after a couple of days of taking it he feels bloated and constipated even though he isn't.  Diarrhea worse when up moving around. He has anywhere from 15-20 loose, small volume BMs a day. Diarrhea not related to meals, in fact he has some nocturnal diarrhea. Patient drinks a cup of coffee in am, no other caffeine during the day.  He doesn't take any prescription meds. No herbs. Of note, patient took 5 days of prednisone for knee problems two months ago. This helped diarrhea significantly but had recurrent diarrhea a few days after completion of steroids. Patent sees no correlation between stress and diarrhea. He does describe left mid abdominal discomfort, usually worse in am and gone by midday. He  gets neck cramps, no joint pain. No skin rashes except small rash on right knee present since knee surgery. No flushing   Current Medications, Allergies, Past Medical History, Past Surgical History, Family History and Social History were reviewed in [employer] record.  Physical Exam: General: Well developed , white male in no acute distress Head: Normocephalic and atraumatic Eyes:  sclerae anicteric, conjunctiva pink  Ears: Normal auditory acuity Lungs: Clear throughout to auscultation Heart: Regular rate and rhythm Abdomen: Soft, non distended, non-tender. No masses, no  hepatomegaly. Normal bowel sounds Musculoskeletal: Symmetrical with no gross deformities  Extremities: No edema  Neurological: Alert oriented x 4, grossly nonfocal Psychological:  Alert and cooperative. Normal mood and affect  Assessment and Recommendations: Chronic diarrhea. Patient having 15-20 small volume loose stools a day. Extensive workup has been unrevealing. He has transient improvement with Flagyl. Also had transient improvement with steroids (for knee). He endorses vague left mid abdominal discomfort. Patient hasn't lost any weight. No signs of hyperthyroidism. His quality of life / work is being impacted. Of note, diarrhea is not related to eating and he does have some nocturnal diarrhea. Carcinoid a remote possibility will any further testing to Dr. Christella Hartigan, patient's primary GI.  For now will try Lomotil.

## 2012-08-27 ENCOUNTER — Encounter: Payer: Self-pay | Admitting: Nurse Practitioner

## 2012-08-27 DIAGNOSIS — R197 Diarrhea, unspecified: Secondary | ICD-10-CM | POA: Insufficient documentation

## 2012-08-28 ENCOUNTER — Other Ambulatory Visit: Payer: Self-pay | Admitting: *Deleted

## 2012-08-28 ENCOUNTER — Telehealth: Payer: Self-pay | Admitting: *Deleted

## 2012-08-28 DIAGNOSIS — R197 Diarrhea, unspecified: Secondary | ICD-10-CM

## 2012-08-28 MED ORDER — PREDNISONE 10 MG PO TABS: ORAL_TABLET | ORAL | Status: DC

## 2012-08-28 NOTE — Telephone Encounter (Signed)
Timothy Ortiz, will you call in Prednisone for patient and ask him not to take ANY antidiarrheals while on prednisone. We need to hear back from him after completion as to whether steroids helped diarrhea and how much. Following that he will need to complete urinary testing for ? Carcinoid. Please see my office note. Thanks ----- Message ----- From: Princella Pellegrini. Zehr, PA-C Sent: 08/28/2012 9:27 AM To: Meredith Pel, NP  Spoke with patient and gave him recommendations. Rx sent for Prednisone as per Willette Cluster, NP. Lab in [facility]. Patient to call back when completes Prednisone with an update.

## 2012-08-28 NOTE — Progress Notes (Signed)
Still unclear what is causing his diarrhea.  Can we get urinary 24 5hiaa level for carcinoid syndrome?  Also can you call him in a script for prednisone dose pack (tapering dose from 40mg  a day to 0 over 7-8 days), he should call in 2 weeks to report on whether that helped.  No lomotil during that test.  I would like to see if his diarrheal reliably resolves with steroids.

## 2012-09-07 ENCOUNTER — Telehealth: Payer: Self-pay | Admitting: *Deleted

## 2012-09-07 NOTE — Telephone Encounter (Signed)
Timothy Ortiz, can we go ahead with the 24 hour urine for 5hiaa please. Thanks

## 2012-09-07 NOTE — Telephone Encounter (Signed)
Spoke with patient and he will come back the lab and get the instructions for 24 hour urine.

## 2012-09-07 NOTE — Telephone Encounter (Signed)
Message copied by Daphine Deutscher on Mon Sep 07, 2012 11:21 AM ------      Message from: Daphine Deutscher      Created: Fri Aug 28, 2012  1:33 PM       Did patient call [name] with update ------

## 2012-09-07 NOTE — Telephone Encounter (Signed)
Called patient to check on condition update. He states the Prednisone decreased the frequency of diarrhea in half when he first started it. It has returned now. He also wants to let Willette Cluster, NP know that he found out his grandmother had a carcinoid in her colon.

## 2012-09-14 ENCOUNTER — Other Ambulatory Visit: Payer: 59

## 2012-09-14 DIAGNOSIS — R197 Diarrhea, unspecified: Secondary | ICD-10-CM

## 2012-09-16 ENCOUNTER — Telehealth: Payer: Self-pay | Admitting: Nurse Practitioner

## 2012-09-16 NOTE — Telephone Encounter (Signed)
Discussed with pt that the lab is in process that the result is not back yet. Let pt know we will call pt when results are ready.

## 2012-09-29 ENCOUNTER — Other Ambulatory Visit: Payer: Self-pay | Admitting: *Deleted

## 2012-09-29 DIAGNOSIS — R197 Diarrhea, unspecified: Secondary | ICD-10-CM

## 2012-10-13 ENCOUNTER — Other Ambulatory Visit (INDEPENDENT_AMBULATORY_CARE_PROVIDER_SITE_OTHER): Payer: 59

## 2012-10-13 DIAGNOSIS — R197 Diarrhea, unspecified: Secondary | ICD-10-CM

## 2012-11-04 ENCOUNTER — Encounter: Payer: Self-pay | Admitting: Gastroenterology

## 2012-11-04 ENCOUNTER — Ambulatory Visit (INDEPENDENT_AMBULATORY_CARE_PROVIDER_SITE_OTHER): Payer: 59 | Admitting: Gastroenterology

## 2012-11-04 VITALS — BP 118/70 | HR 88 | Ht 71.5 in | Wt 223.5 lb

## 2012-11-04 DIAGNOSIS — R197 Diarrhea, unspecified: Secondary | ICD-10-CM

## 2012-11-04 MED ORDER — DIPHENOXYLATE-ATROPINE 2.5-0.025 MG PO TABS: 2.0000 | ORAL_TABLET | Freq: Four times a day (QID) | ORAL | Status: AC | PRN

## 2012-11-04 NOTE — Progress Notes (Signed)
Review of pertinent gastrointestinal problems:  1. Chronic loose stools: 11/2011 colonoscopy normal(exept small TAs) to TI, random biopsies normal. Labs 10/13 celiac panel normal, stool testing all normal. 01/2012 started on flagyl empirically with intitally good improvement in symptoms. Labs 2014: Fecal fat negative, 5 H. pylori a urinary levels normal, stool magnesium normal, stool phenolphthalein normal, GI "pathogen panel" was negative, TSH was normal. Empiric steroid trial helped initially then waned. 2. Adenomatous polyps: small TAs removed 11/2011, recommended repeat colonoscopy at 3 year interval.   HPI: This is a  very pleasant 42 year old man whom I last saw several months ago.   He has chronic diarrhea, 25 loose stools per day for at least a year per his history. I have no explanation for his diarrhea after extensive testing, see summary above.   Had cortisone shot in his knee and bowels improved.  Was on prednisone for about a week, improved at first then waned by day 2-3.  No bleeding.  Has 25 watery stools per day.  Drinks 1 liter soda per day, 1 cup coffee.  This is less that he used to drink.   Past Medical History  Diagnosis Date  . Back injury   . Knee injury   . Depression   . GERD (gastroesophageal reflux disease)     Past Surgical History  Procedure Laterality Date  . Tympanostomy tube placement    . Knee surgery      right  . Tympanectomy    . Wisdom teeth extractions      No current outpatient prescriptions on file.   No current facility-administered medications for this visit.    Allergies as of 11/04/2012 - Review Complete 11/04/2012  Allergen Reaction Noted  . Poison oak extract [extract of poison oak]  11/04/2012    Family History  Problem Relation Age of Onset  . Colon cancer Maternal Grandmother 95  . Diabetes Father     History   Social History  . Marital Status: Married    Spouse Name: N/A    Number of Children: 4  . Years of  Education: N/A   Occupational History  .     Social History Main Topics  . Smoking status: Former Smoker    Quit date: 11/19/2009  . Smokeless tobacco: Never Used  . Alcohol Use: No  . Drug Use: No  . Sexual Activity: Not on file   Other Topics Concern  . Not on file   Social History Narrative  . No narrative on file      Physical Exam: BP 118/70  Pulse 88  Ht 5' 11.5" (1.816 m)  Wt 223 lb 8 oz (101.379 kg)  BMI 30.74 kg/m2 Constitutional: generally well-appearing Psychiatric: alert and oriented x3 Abdomen: soft, nontender, nondistended, no obvious ascites, no peritoneal signs, normal bowel sounds     Assessment and plan: 42 y.o. male with chronic diarrhea of unclear etiology  I'm going to refer him to [facility] gastroenterology for another opinion on his chronic diarrhea which I have been unable to explain or slow down. We are going to try him on Lomotil he'll take 3-4 pills per day. He knows he can take up to 8 per day but in my experience this 3-4 pills don't help significantly than adding more on top of it will probably not help either.

## 2012-11-04 NOTE — Patient Instructions (Addendum)
Referral to [facility] for chronic diarrhea that has not been explained with my tests.  If you have not heard from [facility] in a week please call [phone] and I will check on the appointment for you. Trial of lomotil 3-4 pills per day, new script written.   Appt for Dr Boyd Kerbs 01/19/13 1130 am pt has been notified and Marilynne Drivers will mail paperwork to the pt

## 2013-01-11 ENCOUNTER — Telehealth: Payer: Self-pay | Admitting: Gastroenterology

## 2013-01-11 NOTE — Telephone Encounter (Signed)
[phone] 03/23/13 8 am pt appt rescheduled per pt request

## 2014-12-06 ENCOUNTER — Encounter: Payer: Self-pay | Admitting: Gastroenterology

## 2018-07-02 ENCOUNTER — Ambulatory Visit (INDEPENDENT_AMBULATORY_CARE_PROVIDER_SITE_OTHER): Payer: Self-pay | Admitting: Internal Medicine

## 2018-07-02 ENCOUNTER — Other Ambulatory Visit: Payer: Self-pay

## 2018-07-02 DIAGNOSIS — M25562 Pain in left knee: Secondary | ICD-10-CM

## 2018-07-02 DIAGNOSIS — G8929 Other chronic pain: Secondary | ICD-10-CM

## 2018-07-02 NOTE — Progress Notes (Signed)
Virtual Visit via Video Note  I connected with Timothy Ortiz on 07/02/18 at  2:00 PM EDT by a video enabled telemedicine application and verified that I am speaking with the correct person using two identifiers.  Location patient: home Location provider: work office Persons participating in the virtual visit: patient, provider  I discussed the limitations of evaluation and management by telemedicine and the availability of in person appointments. The patient expressed understanding and agreed to proceed.   HPI: This is a scheduled visit to establish care and to follow up on chronic conditions. He has not had routine medical care. He is obese. Suffers from chronic right knee pain stemming from an injury years ago. He has had multiple surgeries and has been told by 2 orthopedists that there wasn't much else they could do about it. It causes constant pain; he is unable to ambulate or play with his kids. He takes 600 mg of ibuprofen 3-4 times a day. He has had a sigmoid colectomy in the past and has had frequent stools ever since (not diarrhea, stool is formed).  He has 8 children ages 42 to 2. Is not working due to his knee issues. Quit smoking 5 years ago and was a smoker for 15 years. ETOH only socially and rarely. Fm Hx is significant for a father with a cerebral aneurysm that he thinks was from an incline table.   ROS: Constitutional: Denies fever, chills, diaphoresis, appetite change and fatigue.  HEENT: Denies photophobia, eye pain, redness, hearing loss, ear pain, congestion, sore throat, rhinorrhea, sneezing, mouth sores, trouble swallowing, neck pain, neck stiffness and tinnitus.   Respiratory: Denies SOB, DOE, cough, chest tightness,  and wheezing.   Cardiovascular: Denies chest pain, palpitations and leg swelling.  Gastrointestinal: Denies nausea, vomiting, abdominal pain, diarrhea, constipation, blood in stool and abdominal distention.  Genitourinary: Denies dysuria, urgency,  frequency, hematuria, flank pain and difficulty urinating.  Endocrine: Denies: hot or cold intolerance, sweats, changes in hair or nails, polyuria, polydipsia. Musculoskeletal: Denies myalgias, back pain. Skin: Denies pallor, rash and wound.  Neurological: Denies dizziness, seizures, syncope, weakness, light-headedness, numbness and headaches.  Hematological: Denies adenopathy. Easy bruising, personal or family bleeding history  Psychiatric/Behavioral: Denies suicidal ideation, mood changes, confusion, nervousness, sleep disturbance and agitation   Past Medical History:  Diagnosis Date  . Back injury   . Depression   . GERD (gastroesophageal reflux disease)   . Knee injury     Past Surgical History:  Procedure Laterality Date  . KNEE SURGERY     right  . Tympanectomy    . TYMPANOSTOMY TUBE PLACEMENT    . wisdom teeth extractions      Family History  Problem Relation Age of Onset  . Colon cancer Maternal Grandmother 95  . Diabetes Father     SOCIAL HX:   reports that he quit smoking about 8 years ago. He has never used smokeless tobacco. He reports that he does not drink alcohol or use drugs.   Current Outpatient Medications:  .  diphenoxylate-atropine (LOMOTIL) 2.5-0.025 MG per tablet, Take 2 tablets by mouth 4 (four) times daily as needed for diarrhea or loose stools (take 2-3 pills per day.)., Disp: 60 tablet, Rfl: 0  EXAM:   VITALS per patient if applicable: none reported  GENERAL: alert, oriented, appears well and in no acute distress  HEENT: atraumatic, conjunttiva clear, no obvious abnormalities on inspection of external nose and ears  NECK: normal movements of the head and neck  LUNGS: on inspection no signs of respiratory distress, breathing rate appears normal, no obvious gross increased work of breathing, gasping or wheezing  CV: no obvious cyanosis  MS: moves all visible extremities without noticeable abnormality  PSYCH/NEURO: pleasant and  cooperative, no obvious depression or anxiety, speech and thought processing grossly intact  ASSESSMENT AND PLAN:   Chronic pain of left knee -Will refer to a third ortho for a final opinion on repair options. -His ibuprofen dosing is too high. He has been counseled on risks, paritcularly GI bleeding and heart effects. -If nothing can be done surgically to the knee, may have to consider pain management.  Morbid obesity ([place]) -Discussed healthy lifestyle.     I discussed the assessment and treatment plan with the patient. The patient was provided an opportunity to ask questions and all were answered. The patient agreed with the plan and demonstrated an understanding of the instructions.   The patient was advised to call back or seek an in-person evaluation if the symptoms worsen or if the condition fails to improve as anticipated.    Timothy Frohlich, MD  [facility]
